# Patient Record
Sex: Male | Born: 1955 | Race: White | Hispanic: No | Marital: Married | Smoking: Never smoker
Health system: Southern US, Community
[De-identification: ages and names within clinical notes are randomized; demographics above are authoritative.]

## PROBLEM LIST (undated history)

## (undated) DIAGNOSIS — I10 Essential (primary) hypertension: Secondary | ICD-10-CM

## (undated) DIAGNOSIS — C439 Malignant melanoma of skin, unspecified: Secondary | ICD-10-CM

## (undated) DIAGNOSIS — R1011 Right upper quadrant pain: Secondary | ICD-10-CM

## (undated) HISTORY — PX: KNEE SURGERY: SHX244

## (undated) HISTORY — PX: TONSILLECTOMY: SUR1361

---
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
## 2015-07-21 DIAGNOSIS — C439 Malignant melanoma of skin, unspecified: Secondary | ICD-10-CM | POA: Insufficient documentation

## 2015-07-21 DIAGNOSIS — Z86010 Personal history of colonic polyps: Secondary | ICD-10-CM

## 2015-07-21 HISTORY — DX: Personal history of colonic polyps: Z86.010

## 2015-10-08 DIAGNOSIS — E782 Mixed hyperlipidemia: Secondary | ICD-10-CM

## 2015-10-08 DIAGNOSIS — M79602 Pain in left arm: Secondary | ICD-10-CM | POA: Insufficient documentation

## 2015-10-08 DIAGNOSIS — M79601 Pain in right arm: Secondary | ICD-10-CM

## 2015-10-08 DIAGNOSIS — I10 Essential (primary) hypertension: Secondary | ICD-10-CM

## 2015-10-08 HISTORY — DX: Essential (primary) hypertension: I10

## 2015-10-08 HISTORY — DX: Pain in right arm: M79.601

## 2015-10-08 HISTORY — DX: Pain in left arm: M79.602

## 2015-10-08 HISTORY — DX: Mixed hyperlipidemia: E78.2

## 2016-04-15 DIAGNOSIS — D696 Thrombocytopenia, unspecified: Secondary | ICD-10-CM

## 2016-04-15 DIAGNOSIS — M5030 Other cervical disc degeneration, unspecified cervical region: Secondary | ICD-10-CM

## 2016-04-15 HISTORY — DX: Thrombocytopenia, unspecified: D69.6

## 2016-04-15 HISTORY — DX: Other cervical disc degeneration, unspecified cervical region: M50.30

## 2017-11-04 DIAGNOSIS — F419 Anxiety disorder, unspecified: Secondary | ICD-10-CM

## 2017-11-04 DIAGNOSIS — R1011 Right upper quadrant pain: Secondary | ICD-10-CM

## 2017-11-04 HISTORY — DX: Anxiety disorder, unspecified: F41.9

## 2017-11-04 HISTORY — DX: Right upper quadrant pain: R10.11

## 2017-11-11 DIAGNOSIS — K760 Fatty (change of) liver, not elsewhere classified: Secondary | ICD-10-CM

## 2017-11-11 HISTORY — DX: Fatty (change of) liver, not elsewhere classified: K76.0

## 2018-09-01 ENCOUNTER — Emergency Department (HOSPITAL_BASED_OUTPATIENT_CLINIC_OR_DEPARTMENT_OTHER): Payer: 59

## 2018-09-01 ENCOUNTER — Encounter (HOSPITAL_BASED_OUTPATIENT_CLINIC_OR_DEPARTMENT_OTHER): Payer: Self-pay

## 2018-09-01 ENCOUNTER — Other Ambulatory Visit: Payer: Self-pay

## 2018-09-01 ENCOUNTER — Emergency Department (HOSPITAL_BASED_OUTPATIENT_CLINIC_OR_DEPARTMENT_OTHER)
Admission: EM | Admit: 2018-09-01 | Discharge: 2018-09-02 | Disposition: A | Discharge status: Home / Self Care | Payer: 59 | Attending: Emergency Medicine | Admitting: Emergency Medicine

## 2018-09-01 DIAGNOSIS — R0789 Other chest pain: Secondary | ICD-10-CM | POA: Diagnosis present

## 2018-09-01 DIAGNOSIS — I10 Essential (primary) hypertension: Secondary | ICD-10-CM | POA: Insufficient documentation

## 2018-09-01 DIAGNOSIS — Z79899 Other long term (current) drug therapy: Secondary | ICD-10-CM | POA: Diagnosis not present

## 2018-09-01 HISTORY — DX: Malignant melanoma of skin, unspecified: C43.9

## 2018-09-01 HISTORY — DX: Essential (primary) hypertension: I10

## 2018-09-01 LAB — CBC
HCT: 43 % (ref 39.0–52.0)
Hemoglobin: 15.3 g/dL (ref 13.0–17.0)
MCH: 31.8 pg (ref 26.0–34.0)
MCHC: 35.6 g/dL (ref 30.0–36.0)
MCV: 89.4 fL (ref 80.0–100.0)
Platelets: 142 10*3/uL — ABNORMAL LOW (ref 150–400)
RBC: 4.81 MIL/uL (ref 4.22–5.81)
RDW: 12.8 % (ref 11.5–15.5)
WBC: 8.3 10*3/uL (ref 4.0–10.5)
nRBC: 0 % (ref 0.0–0.2)

## 2018-09-01 LAB — TROPONIN I
Troponin I: <0.03 ng/mL (ref <0.03)
Troponin I: <0.03 ng/mL (ref <0.03)

## 2018-09-01 LAB — BASIC METABOLIC PANEL
Anion gap: 9 (ref 5–15)
BUN: 23 mg/dL (ref 8–23)
CO2: 23 mmol/L (ref 22–32)
Calcium: 9 mg/dL (ref 8.9–10.3)
Chloride: 105 mmol/L (ref 98–111)
Creatinine, Ser: 1.41 mg/dL — ABNORMAL HIGH (ref 0.61–1.24)
GFR calc Af Amer: >60 mL/min (ref >60)
GFR calc non Af Amer: 53 mL/min — ABNORMAL LOW (ref >60)
Glucose, Bld: 121 mg/dL — ABNORMAL HIGH (ref 70–99)
Potassium: 3.9 mmol/L (ref 3.5–5.1)
Sodium: 137 mmol/L (ref 135–145)

## 2018-09-01 MED ORDER — METHYLPREDNISOLONE SODIUM SUCC 125 MG IJ SOLR
125.0000 mg | Freq: Once | INTRAMUSCULAR | Status: DC
  Filled 2018-09-01: qty 2

## 2018-09-01 MED ORDER — SODIUM CHLORIDE 0.9% FLUSH
3.0000 mL | Freq: Once | INTRAVENOUS | Status: DC
  Filled 2018-09-01: qty 3

## 2018-09-01 MED ORDER — ALBUTEROL SULFATE HFA 108 (90 BASE) MCG/ACT IN AERS: 1.0000 | INHALATION_SPRAY | Freq: Four times a day (QID) | RESPIRATORY_TRACT | 0 refills | Status: DC | PRN

## 2018-09-01 MED ORDER — PREDNISONE 10 MG PO TABS: 40.0000 mg | ORAL_TABLET | Freq: Every day | ORAL | 0 refills | Status: AC

## 2018-09-01 NOTE — Discharge Instructions (Addendum)
Please follow-up with your primary care provider for your scheduled outpatient cardiac workup including stress test and echo. I have requested an appointment with our cardiologist here - if they can get you in sooner, then see them.  Until you see the cardiologist, take aspirin 81 mg once a day.  If you have more chest pain, take a nitroglycerin pill under your tongue. You can take as many as three pills, five minutes apart. If you still have pain after the third pill, come to the hospital immediately.  Thank you for allowing me to care for you today. Please return to the emergency department if you have new or worsening symptoms. Take your medications as instructed.

## 2018-09-01 NOTE — ED Triage Notes (Signed)
C/o CP x 3 week-seen by PCP with plans for additional testing-NAD-steady gait

## 2018-09-01 NOTE — ED Provider Notes (Signed)
[facility] EMERGENCY DEPARTMENT Provider Note   CSN: [id number] Arrival date & time: 09/01/18  1848    History   Chief Complaint Chief Complaint  Patient presents with  . Chest Pain    HPI Evan Beltran is a 63 y.o. male.     Patient is a 63 year old patient with PMH HTN and melanoma presenting to the ED for feeling unwell with chest pain. Patient reports the chest pain has been ongoing for several weeks and was actually improved overall today. He saw PMD for this and is scheduled for echo and stress test. He reports today he felt a pain that he never felt before in his upper gums and then in the R jaw and was concerned so he wanted to come and get it checked out. Currently denies chest pain. Reports when it comes on it is a dull ache in the L chest and is transient. It is not exertional. Denies SOB, n/v/d. Has chronic L arm pain due to musculoskeletal issues for which he gets periodic steroid injections over the years. No arm pain today. Jaw pain lasted a few minutes and resided. Reports this occasionally comes on when he chews     Past Medical History:  Diagnosis Date  . Hypertension   . Melanoma ([place])     There are no active problems to display for this patient.   Past Surgical History:  Procedure Laterality Date  . KNEE SURGERY    . TONSILLECTOMY          Home Medications    Prior to Admission medications   Medication Sig Start Date End Date Taking? Authorizing Provider  lisinopril (ZESTRIL) 10 MG tablet TAKE 1 TABLET BY MOUTH EVERY DAY 05/06/17  Yes [provider]  albuterol (VENTOLIN HFA) 108 (90 Base) MCG/ACT inhaler Inhale 1-2 puffs into the lungs every 6 (six) hours as needed for wheezing or shortness of breath. 09/01/18   Madilyn Hook A, PA-C  predniSONE (DELTASONE) 10 MG tablet Take 4 tablets (40 mg total) by mouth daily for 5 days. 09/01/18 09/06/18  Alveria Apley, PA-C    Family History No family history on file.  Social History  Social History   Tobacco Use  . Smoking status: Never Smoker  . Smokeless tobacco: Never Used  Substance Use Topics  . Alcohol use: Yes    Comment: occ  . Drug use: Never     Allergies   Patient has no known allergies.   Review of Systems Review of Systems  Constitutional: Negative for chills and fever.  HENT: Positive for dental problem. Negative for ear pain, postnasal drip, sore throat and trouble swallowing.   Eyes: Negative for pain and visual disturbance.  Respiratory: Negative for cough and shortness of breath.   Cardiovascular: Positive for chest pain. Negative for palpitations.  Gastrointestinal: Negative for abdominal pain, nausea and vomiting.  Genitourinary: Negative for dysuria and hematuria.  Musculoskeletal: Positive for arthralgias. Negative for back pain, neck pain and neck stiffness.  Skin: Negative for color change and rash.  Neurological: Negative for dizziness, syncope and light-headedness.  All other systems reviewed and are negative.    Physical Exam Updated Vital Signs BP 123/83   Pulse 75   Temp 98.5 F (36.9 C) (Oral)   Resp (!) 21   Ht 6' (1.829 m)   Wt 98.9 kg   SpO2 98%   BMI 29.57 kg/m   Physical Exam Vitals signs and nursing note reviewed.  Constitutional:  Appearance: He is well-developed.  HENT:     Head: Normocephalic and atraumatic.     Jaw: There is normal jaw occlusion. No tenderness.     Mouth/Throat:     Lips: Pink.     Mouth: Mucous membranes are moist.     Dentition: Normal dentition. No dental tenderness, dental abscesses or gum lesions.     Tongue: No lesions. Tongue does not deviate from midline.     Pharynx: Oropharynx is clear. Uvula midline. No pharyngeal swelling, oropharyngeal exudate, posterior oropharyngeal erythema or uvula swelling.  Eyes:     Conjunctiva/sclera: Conjunctivae normal.  Neck:     Musculoskeletal: Neck supple.  Cardiovascular:     Rate and Rhythm: Normal rate and regular rhythm.      Heart sounds: No murmur.  Pulmonary:     Effort: Pulmonary effort is normal. No respiratory distress.     Breath sounds: Normal breath sounds.  Abdominal:     Palpations: Abdomen is soft.     Tenderness: There is no abdominal tenderness.  Skin:    General: Skin is warm and dry.  Neurological:     Mental Status: He is alert.      ED Treatments / Results  Labs (all labs ordered are listed, but only abnormal results are displayed) Labs Reviewed  BASIC METABOLIC PANEL - Abnormal; Notable for the following components:      Result Value   Glucose, Bld 121 (*)    Creatinine, Ser 1.41 (*)    GFR calc non Af Amer 53 (*)    All other components within normal limits  CBC - Abnormal; Notable for the following components:   Platelets 142 (*)    All other components within normal limits  TROPONIN I  TROPONIN I  TROPONIN I    EKG EKG Interpretation  Date/Time:  Thursday Sep 01 2018 18:59:16 EDT Ventricular Rate:  71 PR Interval:  168 QRS Duration: 92 QT Interval:  386 QTC Calculation: 419 R Axis:   -38 Text Interpretation:  Normal sinus rhythm with sinus arrhythmia Left axis deviation Abnormal ECG no acute ischemic changes. no ld comparison Confirmed by Charlesetta Shanks [phone]) on 09/01/2018 9:47:19 PM   Radiology Dg Chest 2 View  Result Date: 09/01/2018 CLINICAL DATA:  63 y/o  M; 3 weeks of left-sided chest pain. EXAM: CHEST - 2 VIEW COMPARISON:  None. FINDINGS: The heart size and mediastinal contours are within normal limits. Both lungs are clear. Mild S-shaped thoracic spine scoliosis and straightening of kyphosis. No acute osseous abnormality is evident. IMPRESSION: No acute pulmonary process identified. Electronically Signed   By: Kristine Garbe M.D.   On: 09/01/2018 20:01    Procedures Procedures (including critical care time)  Medications Ordered in ED Medications  sodium chloride flush (NS) 0.9 % injection 3 mL (has no administration in time range)      Initial Impression / Assessment and Plan / ED Course  I have reviewed the triage vital signs and the nursing notes.  Pertinent labs & imaging results that were available during my care of the patient were reviewed by me and considered in my medical decision making (see chart for details).  Clinical Course as of May 22 0001  Thu Sep 01, 2018  2355 Patient remained chest pain free throughout our visit. Attempted to call [facility] but they stated patient was not a patient of theirs. It appears patients cardiac workup will be done by primary care provider initially. Patient stable and trop negative  x2. No ekg changes. Will obtain trop x3 and if normal, may discharge home to PMD follow up. Patient care passed to Dr. Roxanne Mins due to change of shift.     [KM]    Clinical Course User Index [KM] Alveria Apley, PA-C         Final Clinical Impressions(s) / ED Diagnoses   Final diagnoses:  Atypical chest pain    ED Discharge Orders         Ordered    predniSONE (DELTASONE) 10 MG tablet  Daily     09/01/18 2022    albuterol (VENTOLIN HFA) 108 (90 Base) MCG/ACT inhaler  Every 6 hours PRN     09/01/18 2022           Kristine Royal [birth year] [id number]    Delora Fuel, MD 70/78/67 [phone]

## 2018-09-01 NOTE — ED Notes (Signed)
[place] wife to be updated with bed placement

## 2018-09-02 LAB — TROPONIN I: Troponin I: <0.03 ng/mL (ref <0.03)

## 2018-09-02 MED ORDER — NITROGLYCERIN 0.4 MG SL SUBL: 0.4000 mg | SUBLINGUAL_TABLET | SUBLINGUAL | 0 refills | Status: AC | PRN

## 2018-09-02 NOTE — ED Notes (Signed)
ED Provider at bedside. 

## 2018-09-16 DIAGNOSIS — R0789 Other chest pain: Secondary | ICD-10-CM

## 2018-09-16 HISTORY — DX: Other chest pain: R07.89

## 2018-09-16 NOTE — Progress Notes (Signed)
Cardiology Office Note:    Date:  09/19/2018   ID:  Evan Beltran, DOB [birth year], MRN [id number]  PCP:  Houston Siren., MD  Cardiologist:  Shirlee More, MD   Referring MD: Delora Fuel, MD  ASSESSMENT:    1. Atypical chest pain   2. Abnormal electrocardiogram (ECG) (EKG)   3. Benign essential hypertension   4. Mixed hyperlipidemia    PLAN:    In order of problems listed above:  1. Chest pain is ongoing further evaluation cardiac CTA class I 2. EKG has nonspecific abnormalities axis deviation 3. Stable hyperlipidemia continue current treatment beta-blocker ACE inhibitor 4. Mild hyperlipidemia if he has CAD he would benefit from statin therapy and a goal LDL in the range of 50.  Next appointment in 6 weeks   Medication Adjustments/Labs and Tests Ordered: Current medicines are reviewed at length with the patient today.  Concerns regarding medicines are outlined above.  Orders Placed This Encounter  Procedures  . CT CORONARY MORPH W/CTA COR W/SCORE W/CA W/CM &/OR WO/CM  . CT CORONARY FRACTIONAL FLOW RESERVE FLUID ANALYSIS  . CT CORONARY FRACTIONAL FLOW RESERVE DATA PREP   Meds ordered this encounter  Medications  . metoprolol tartrate (LOPRESSOR) 50 MG tablet    Sig: Take 2 tabs (100 mg) 2 hour prior to CT    Dispense:  2 tablet    Refill:  0     Chief Complaint  Patient presents with  . Chest Pain    History of Present Illness:    Evan Beltran is a 63 y.o. male with hypertensionwho is being seen today for the evaluation of chest pain at the request of Delora Fuel, MD following ED visit 09/01/18.   Ref Range & Units 2wk ago (09/02/18) 2wk ago (09/01/18) 2wk ago (09/01/18)  Troponin I <0.03 ng/mL <0.03  <0.03 CM <0.03   EKG EKG Interpretation  Date/Time:                  Thursday Sep 01 2018 18:59:16 EDT Ventricular Rate:         71 PR Interval:                 168 QRS Duration: 92 QT Interval:                 386 QTC Calculation:        419 R Axis:                          -38 Text Interpretation:       Normal sinus rhythm with sinus arrhythmia Left axis deviation Abnormal ECG no acute ischemic changes. no ld comparison Confirmed by Charlesetta Shanks [phone]) on 09/01/2018 9:47:19 PM  CXR 09/01/18: FINDINGS: The heart size and mediastinal contours are within normal limits. Both lungs are clear. Mild S-shaped thoracic spine scoliosis and straightening of kyphosis. No acute osseous abnormality is evident.  IMPRESSION: No acute pulmonary process identified.  Stress echo 09/15/18: Summary Normal resting biventricular function (ejection fraction), with no resting segmental abnormality. No clinical or echocardiographic ischemia (induced wall motion abnormality): Negative stress echocardiogram with target HR achieved. Signature ------------------------------------------------------------------------------  Electronically signed by Clarene Critchley, MD, FACC(Interpreting physician)  on 09/15/2018 03:50 PM ------------------------------------------------------------------------------  Rest  ECG  Sinus rhythm.  Right bundle branch block.  Stress  Stress Type: Exercise  Peak HR: 144 bpm             HR Response: Normal  Peak BP: 170/80 mmHg           BP Response: Normal  Predicted HR: 157 bpm           HR BP Product: 24480  % of predicted HR: 92           Max Exercise: 8.5 METS  Test Duration: 5.02 min  Reason for Termination: Target heart rate Exercise Effort: Good  Stress Interpretation  Normal resting left ventricular function, with no resting segmental  abnormality.  Normal hypercontractile response throughout, with no induced wall motion  abnormality.  Appropriate hemodynamic response to exercise.  Results  Global LVEF (rest): Normal (LVEF >50%)  Global LVEF (stress): Hyperkinetic (LVEF >70%)  ECG  No ST-T wave changes.  Arrhythmias  No rhythm abnormality.  Symptoms  No  cardiovascular symptoms with maximal exercise.   He is here today for cardiology consultation.  First issue is that he continues to have left axillary and left chest pain nonexertional waxes and wanes through the day.  He had a stress test ordered when he was seen by his PCP in May and the result was normal stress echo.  He is very concerned about cardiac disease his brother last Friday had some atypical symptoms had a cardiac arrest and not having multivessel PCI performed.  His chest pain is nonexertional unrelieved with rest waxes and wanes through the day some soreness in the left shoulder but also has had aching in his jaw.  In fact he had an episode coming to the office today and attributes this to stress and seems to occur with stress.  He is on medical therapy for hypertension.  After discussion of the benefits and limitations of stress test modalities undergo a cardiac CTA.  Allow [place] to look at the noncardiac chest structures do a calcium score and define whether or not he has obstructive CAD to a higher degree of accuracy the noninvasive stress testing.  He has no dye allergy and will be scheduled as class I.  If his symptoms worsen become more typical he will present for repeat evaluation of troponin would be positive at that time treat as acute coronary syndrome.  He has no known history of congenital or rheumatic heart disease he has no exercise intolerance exertional shortness of breath palpitations syncope or TIA.  He said no trauma to his chest his chest pain is mild to moderate intensity and is not pleuritic no associated shortness of breath no associated GI symptoms.  Past Medical History:  Diagnosis Date  . Anxiety 11/04/2017  . Atypical chest pain 09/16/2018  . Benign essential hypertension 10/08/2015  . DDD (degenerative disc disease), cervical 04/15/2016  . Fatty liver 11/11/2017  . History of adenomatous polyp of colon 07/21/2015  . Hypertension   . Melanoma ([place])   . Mixed hyperlipidemia  10/08/2015  . Paresthesia and pain of both upper extremities 10/08/2015  . RUQ abdominal pain 11/04/2017  . Thrombocytopenia ([place]) 04/15/2016    Past Surgical History:  Procedure Laterality Date  . KNEE SURGERY    . TONSILLECTOMY      Current Medications: Current Meds  Medication Sig  . aspirin EC 81 MG tablet Take 81 mg by mouth daily.  Marland Kitchen lisinopril (ZESTRIL) 10 MG tablet TAKE 1 TABLET BY MOUTH EVERY DAY  . nitroGLYCERIN (NITROSTAT) 0.4 MG SL tablet Place 1 tablet (0.4 mg total) under the tongue every 5 (five) minutes as needed for chest pain.     Allergies:   Patient has  no known allergies.   Social History   Socioeconomic History  . Marital status: Married    Spouse name: Not on file  . Number of children: Not on file  . Years of education: Not on file  . Highest education level: Not on file  Occupational History  . Not on file  Social Needs  . Financial resource strain: Not on file  . Food insecurity:    Worry: Not on file    Inability: Not on file  . Transportation needs:    Medical: Not on file    Non-medical: Not on file  Tobacco Use  . Smoking status: Never Smoker  . Smokeless tobacco: Never Used  Substance and Sexual Activity  . Alcohol use: Yes    Comment: occ  . Drug use: Never  . Sexual activity: Not on file  Lifestyle  . Physical activity:    Days per week: Not on file    Minutes per session: Not on file  . Stress: Not on file  Relationships  . Social connections:    Talks on phone: Not on file    Gets together: Not on file    Attends religious service: Not on file    Active member of club or organization: Not on file    Attends meetings of clubs or organizations: Not on file    Relationship status: Not on file  Other Topics Concern  . Not on file  Social History Narrative  . Not on file     Family History: The patient's family history includes Alzheimer's disease in his father; Angina in his father; CAD in his brother and maternal aunt;  Cancer in his brother; Dementia in his father; Lung cancer in his mother.  ROS:   Review of Systems  Constitution: Negative.  HENT: Negative.   Eyes: Negative.   Cardiovascular: Positive for chest pain.  Respiratory: Negative.   Endocrine: Negative.   Hematologic/Lymphatic: Negative.   Skin: Negative.   Musculoskeletal: Negative.   Gastrointestinal: Negative.   Genitourinary: Negative.   Neurological: Negative.   Psychiatric/Behavioral: Negative.   Allergic/Immunologic: Negative.    Please see the history of present illness.     All other systems reviewed and are negative.  EKGs/Labs/Other Studies Reviewed:     Recent Labs: 09/01/2018: BUN 23; Creatinine, Ser 1.41; Hemoglobin 15.3; Platelets 142; Potassium 3.9; Sodium 137  Recent Lipid Panel 10/30/18: Chol 137 LDL 103 HDL 40  Physical Exam:    VS:  BP 116/82 (BP Location: Left Arm, Patient Position: Sitting, Cuff Size: Normal)   Pulse 66   Temp 97.9 F (36.6 C)   Ht 5\' 11"  (1.803 m)   Wt 214 lb 12.8 oz (97.4 kg)   SpO2 96%   BMI 29.96 kg/m     Wt Readings from Last 3 Encounters:  09/19/18 214 lb 12.8 oz (97.4 kg)  09/01/18 218 lb (98.9 kg)     GEN:  Well nourished, well developed in no acute distress HEENT: Normal NECK: No JVD; No carotid bruits LYMPHATICS: No lymphadenopathy CARDIAC: RRR, no murmurs, rubs, gallops RESPIRATORY:  Clear to auscultation without rales, wheezing or rhonchi  ABDOMEN: Soft, non-tender, non-distended MUSCULOSKELETAL:  No edema; No deformity  SKIN: Warm and dry NEUROLOGIC:  Alert and oriented x 3 PSYCHIATRIC:  Normal affect     Signed, Shirlee More, MD  09/19/2018 8:51 AM    [place]

## 2018-09-19 ENCOUNTER — Encounter: Payer: Self-pay | Admitting: Cardiology

## 2018-09-19 ENCOUNTER — Ambulatory Visit (INDEPENDENT_AMBULATORY_CARE_PROVIDER_SITE_OTHER): Payer: 59 | Admitting: Cardiology

## 2018-09-19 ENCOUNTER — Other Ambulatory Visit: Payer: Self-pay

## 2018-09-19 VITALS — BP 116/82 | HR 66 | Temp 97.9°F | Ht 71.0 in | Wt 214.8 lb

## 2018-09-19 DIAGNOSIS — E782 Mixed hyperlipidemia: Secondary | ICD-10-CM

## 2018-09-19 DIAGNOSIS — R0789 Other chest pain: Secondary | ICD-10-CM | POA: Diagnosis not present

## 2018-09-19 DIAGNOSIS — Z01812 Encounter for preprocedural laboratory examination: Secondary | ICD-10-CM

## 2018-09-19 DIAGNOSIS — I10 Essential (primary) hypertension: Secondary | ICD-10-CM

## 2018-09-19 DIAGNOSIS — R9431 Abnormal electrocardiogram [ECG] [EKG]: Secondary | ICD-10-CM

## 2018-09-19 MED ORDER — METOPROLOL TARTRATE 50 MG PO TABS: ORAL_TABLET | ORAL | 0 refills | Status: DC

## 2018-09-19 NOTE — Patient Instructions (Signed)
Medication Instructions:  Your physician recommends that you continue on your current medications as directed. Please refer to the Current Medication list given to you today.  If you need a refill on your cardiac medications before your next appointment, please call your pharmacy.   Lab work: Your physician recommends that you return for lab work in:  BMP 3-7 days prior to CT  If you have labs (blood work) drawn today and your tests are completely normal, you will receive your results only by: Marland Kitchen MyChart Message (if you have MyChart) OR . A paper copy in the mail If you have any lab test that is abnormal or we need to change your treatment, we will call you to review the results.  Testing/Procedures: Your physician has requested that you have cardiac CT. Cardiac computed tomography (CT) is a painless test that uses an x-ray machine to take clear, detailed pictures of your heart. For further information please visit [employer]uge[employer]tn. Please follow instruction sheet as given.  Please arrive at the [facility] main entrance of [facility] at xx:xx AM (30-45 minutes prior to test start time)  [facility] [place], [place] [address] [phone]  Proceed to the [facility] Radiology Department (First Floor).  Please follow these instructions carefully (unless otherwise directed):  Hold all erectile dysfunction medications at least 48 hours prior to test.  On the Night Before the Test: . Be sure to Drink plenty of water. . Do not consume any caffeinated/decaffeinated beverages or chocolate 12 hours prior to your test. . Do not take any antihistamines 12 hours prior to your test.  On the Day of the Test: . Drink plenty of water. Do not drink any water within one hour of the test. . Do not eat any food 4 hours prior to the test. . You may take your regular medications prior to the test.  . Take metoprolol (Lopressor) two hours prior to test.    *For Clinical Staff only. Please instruct patient the following:*        -Drink plenty of water       -Hold Furosemide/hydrochlorothiazide morning of the test       -Take metoprolol (Lopressor) 2 hours prior to test (if applicable).                  -If HR is less than 55 BPM- No Beta Blocker                -IF HR is greater than 55 BPM and patient is less than or equal to 74 yrs old Lopressor 100mg  x1.       After the Test: . Drink plenty of water. . After receiving IV contrast, you may experience a mild flushed feeling. This is normal. . On occasion, you may experience a mild rash up to 24 hours after the test. This is not dangerous. If this occurs, you can take Benadryl 25 mg and increase your fluid intake. . If you experience trouble breathing, this can be serious. If it is severe call 911 IMMEDIATELY. If it is mild, please call our office. . If you take any of these medications: Glipizide/Metformin, Avandament, Glucavance, please do not take 48 hours after completing test.   Follow-Up: At [facility], you and your health needs are our priority.  As part of our continuing mission to provide you with exceptional heart care, we have created designated Provider Care Teams.  These Care Teams include your primary  Cardiologist (physician) and Advanced Practice Providers (APPs -  Physician Assistants and Nurse Practitioners) who all work together to provide you with the care you need, when you need it. You will need a follow up appointment in 6 weeks.   Any Other Special Instructions Will Be Listed Below (If Applicable).    Cardiac CT Angiogram  A cardiac CT angiogram is a procedure to look at the heart and the area around the heart. It may be done to help find the cause of chest pains or other symptoms of heart disease. During this procedure, a large X-ray machine, called a CT scanner, takes detailed pictures of the heart and the surrounding area after a dye (contrast material) has been  injected into blood vessels in the area. The procedure is also sometimes called a coronary CT angiogram, coronary artery scanning, or CTA. A cardiac CT angiogram allows the health care provider to see how well blood is flowing to and from the heart. The health care provider will be able to see if there are any problems, such as:  Blockage or narrowing of the coronary arteries in the heart.  Fluid around the heart.  Signs of weakness or disease in the muscles, valves, and tissues of the heart. Tell a health care provider about:  Any allergies you have. This is especially important if you have had a previous allergic reaction to contrast dye.  All medicines you are taking, including vitamins, herbs, eye drops, creams, and over-the-counter medicines.  Any blood disorders you have.  Any surgeries you have had.  Any medical conditions you have.  Whether you are pregnant or may be pregnant.  Any anxiety disorders, chronic pain, or other conditions you have that may increase your stress or prevent you from lying still. What are the risks? Generally, this is a safe procedure. However, problems may occur, including:  Bleeding.  Infection.  Allergic reactions to medicines or dyes.  Damage to other structures or organs.  Kidney damage from the dye or contrast that is used.  Increased risk of cancer from radiation exposure. This risk is low. Talk with your health care provider about: ? The risks and benefits of testing. ? How you can receive the lowest dose of radiation. What happens before the procedure?  Wear comfortable clothing and remove any jewelry, glasses, dentures, and hearing aids.  Follow instructions from your health care provider about eating and drinking. This may include: ? For 12 hours before the test - avoid caffeine. This includes tea, coffee, soda, energy drinks, and diet pills. Drink plenty of water or other fluids that do not have caffeine in them. Being  well-hydrated can prevent complications. ? For 4-6 hours before the test - stop eating and drinking. The contrast dye can cause nausea, but this is less likely if your stomach is empty.  Ask your health care provider about changing or stopping your regular medicines. This is especially important if you are taking diabetes medicines, blood thinners, or medicines to treat erectile dysfunction. What happens during the procedure?  Hair on your chest may need to be removed so that small sticky patches called electrodes can be placed on your chest. These will transmit information that helps to monitor your heart during the test.  An IV tube will be inserted into one of your veins.  You might be given a medicine to control your heart rate during the test. This will help to ensure that good images are obtained.  You will be asked to  lie on an exam table. This table will slide in and out of the CT machine during the procedure.  Contrast dye will be injected into the IV tube. You might feel warm, or you may get a metallic taste in your mouth.  You will be given a medicine (nitroglycerin) to relax (dilate) the arteries in your heart.  The table that you are lying on will move into the CT machine tunnel for the scan.  The person running the machine will give you instructions while the scans are being done. You may be asked to: ? Keep your arms above your head. ? Hold your breath. ? Stay very still, even if the table is moving.  When the scanning is complete, you will be moved out of the machine.  The IV tube will be removed. The procedure may vary among health care providers and hospitals. What happens after the procedure?  You might feel warm, or you may get a metallic taste in your mouth from the contrast dye.  You may have a headache from the nitroglycerin.  After the procedure, drink water or other fluids to wash (flush) the contrast material out of your body.  Contact a health care  provider if you have any symptoms of allergy to the contrast. These symptoms include: ? Shortness of breath. ? Rash or hives. ? A racing heartbeat.  Most people can return to their normal activities right after the procedure. Ask your health care provider what activities are safe for you.  It is up to you to get the results of your procedure. Ask your health care provider, or the department that is doing the procedure, when your results will be ready. Summary  A cardiac CT angiogram is a procedure to look at the heart and the area around the heart. It may be done to help find the cause of chest pains or other symptoms of heart disease.  During this procedure, a large X-ray machine, called a CT scanner, takes detailed pictures of the heart and the surrounding area after a dye (contrast material) has been injected into blood vessels in the area.  Ask your health care provider about changing or stopping your regular medicines before the procedure. This is especially important if you are taking diabetes medicines, blood thinners, or medicines to treat erectile dysfunction.  After the procedure, drink water or other fluids to wash (flush) the contrast material out of your body. This information is not intended to replace advice given to you by your health care provider. Make sure you discuss any questions you have with your health care provider. Document Released: 03/12/2008 Document Revised: 02/17/2016 Document Reviewed: 02/17/2016 Elsevier Interactive Patient Education  2019 [employer].

## 2018-10-06 ENCOUNTER — Other Ambulatory Visit: Payer: Self-pay | Admitting: *Deleted

## 2018-10-06 ENCOUNTER — Telehealth (HOSPITAL_COMMUNITY): Payer: Self-pay | Admitting: Emergency Medicine

## 2018-10-06 DIAGNOSIS — R0789 Other chest pain: Secondary | ICD-10-CM

## 2018-10-06 DIAGNOSIS — Z01812 Encounter for preprocedural laboratory examination: Secondary | ICD-10-CM

## 2018-10-06 LAB — BASIC METABOLIC PANEL
BUN/Creatinine Ratio: 20 (ref 10–24)
BUN: 25 mg/dL (ref 8–27)
CO2: 26 mmol/L (ref 20–29)
Calcium: 10.2 mg/dL (ref 8.6–10.2)
Chloride: 103 mmol/L (ref 96–106)
Creatinine, Ser: 1.24 mg/dL (ref 0.76–1.27)
GFR calc Af Amer: 71 mL/min/{1.73_m2} (ref >59)
GFR calc non Af Amer: 61 mL/min/{1.73_m2} (ref >59)
Glucose: 111 mg/dL — ABNORMAL HIGH (ref 65–99)
Potassium: 4.4 mmol/L (ref 3.5–5.2)
Sodium: 136 mmol/L (ref 134–144)

## 2018-10-06 NOTE — Telephone Encounter (Signed)
Left message on voicemail with name and callback number Amaiah Cristiano RN Navigator Cardiac Imaging [facility] [phone] Office [phone] Cell  

## 2018-10-07 ENCOUNTER — Ambulatory Visit (HOSPITAL_COMMUNITY)
Admission: RE | Admit: 2018-10-07 | Discharge: 2018-10-07 | Disposition: A | Discharge status: Home / Self Care | Payer: 59 | Source: Ambulatory Visit | Attending: Cardiology | Admitting: Cardiology

## 2018-10-07 ENCOUNTER — Ambulatory Visit (HOSPITAL_COMMUNITY): Admission: RE | Admit: 2018-10-07 | Payer: 59 | Source: Ambulatory Visit

## 2018-10-07 ENCOUNTER — Other Ambulatory Visit: Payer: Self-pay

## 2018-10-07 DIAGNOSIS — R9431 Abnormal electrocardiogram [ECG] [EKG]: Secondary | ICD-10-CM

## 2018-10-07 DIAGNOSIS — Z006 Encounter for examination for normal comparison and control in clinical research program: Secondary | ICD-10-CM

## 2018-10-07 DIAGNOSIS — R0789 Other chest pain: Secondary | ICD-10-CM

## 2018-10-07 MED ORDER — NITROGLYCERIN 0.4 MG SL SUBL
SUBLINGUAL_TABLET | SUBLINGUAL | Status: AC
  Filled 2018-10-07: qty 2

## 2018-10-07 MED ORDER — IOHEXOL 350 MG/ML SOLN
80.0000 mL | Freq: Once | INTRAVENOUS | Status: AC | PRN
  Administered 2018-10-07: 80 mL via INTRAVENOUS

## 2018-10-07 MED ORDER — NITROGLYCERIN 0.4 MG SL SUBL
0.8000 mg | SUBLINGUAL_TABLET | Freq: Once | SUBLINGUAL | Status: AC
  Administered 2018-10-07: 0.8 mg via SUBLINGUAL
  Filled 2018-10-07: qty 25

## 2018-10-07 NOTE — Research (Signed)
  CADFEM Informed Consent   Subject Name: Evan Beltran  Subject met inclusion and exclusion criteria.  The informed consent form, study requirements and expectations were reviewed with the subject and questions and concerns were addressed prior to the signing of the consent form.  The subject verbalized understanding of the trial requirements.  The subject agreed to participate in the CADFEM trial and signed the informed consent at 1135 on 10-07-2018.  The informed consent was obtained prior to performance of any protocol-specific procedures for the subject.  A copy of the signed informed consent was given to the subject and a copy was placed in the subject's medical record.   Clement Deneault

## 2018-10-07 NOTE — Progress Notes (Signed)
CT scan completed. Tolerated well. D/C home walking. Awake and alert. In no distress. 

## 2018-10-11 ENCOUNTER — Other Ambulatory Visit: Payer: Self-pay | Admitting: *Deleted

## 2018-10-11 MED ORDER — ROSUVASTATIN CALCIUM 5 MG PO TABS: 5.0000 mg | ORAL_TABLET | Freq: Every day | ORAL | 3 refills | Status: AC

## 2018-10-30 NOTE — Progress Notes (Signed)
Cardiology Office Note:    Date:  10/31/2018   ID:  Rielly Corlett, DOB 16-Jul-1955, MRN [id number]  PCP:  Houston Siren., MD  Cardiologist:  Shirlee More, MD    Referring MD: Houston Siren., MD    ASSESSMENT:    1. Atypical chest pain   2. Mixed hyperlipidemia   3. Benign essential hypertension    PLAN:    In order of problems listed above:  1. Stable cardiac CTA is best described as near normal has no obstructive CAD and in view of his personal choices and increased risk of CAD over time we will continue low-dose aspirin and statin.  I will plan to see him back in my office as needed. 2. Continue a statin he will need a follow-up lipid profile plans to do a yearly exam PCP office 3. Stable hypertension BP at target continue ACE inhibitor   Next appointment: As needed   Medication Adjustments/Labs and Tests Ordered: Current medicines are reviewed at length with the patient today.  Concerns regarding medicines are outlined above.  No orders of the defined types were placed in this encounter.  No orders of the defined types were placed in this encounter.   Chief Complaint  Patient presents with   Follow-up    after CCTA    History of Present Illness:    Cliff Damiani is a 63 y.o. male with a hx of chest pain last seen 09/19/2018 and referred for cardiac CTA.Marland Kitchen  10/08/2018:   IMPRESSION: 1. Coronary calcium score of 0.5. This was 25 percentile for age and sex matched control.  2. Normal coronary origin with right dominance.  3. Minimal CAD, CAD RADS 1, risk factor modification is recommended. Consider non-cardiac causes of chest pain.   Compliance with diet, lifestyle and medications: Yes  He is quite reassured by his cardiac CTA.  We discussed options benefits he wants to stay on low-dose aspirin and a statin with his family history of his brother's recent myocardial infarction.  Offered to check a lipid profile but he said he is due for wellness yearly  exam and have them done at his PCPs office he has had no chest pain shortness of breath palpitation or syncope Past Medical History:  Diagnosis Date   Anxiety 11/04/2017   Atypical chest pain 09/16/2018   Benign essential hypertension 10/08/2015   DDD (degenerative disc disease), cervical 04/15/2016   Fatty liver 11/11/2017   History of adenomatous polyp of colon 07/21/2015   Hypertension    Melanoma ([place])    Mixed hyperlipidemia 10/08/2015   Paresthesia and pain of both upper extremities 10/08/2015   RUQ abdominal pain 11/04/2017   Thrombocytopenia ([place]) 04/15/2016    Past Surgical History:  Procedure Laterality Date   KNEE SURGERY     TONSILLECTOMY      Current Medications: Current Meds  Medication Sig   aspirin EC 81 MG tablet Take 81 mg by mouth daily.   lisinopril (ZESTRIL) 10 MG tablet TAKE 1 TABLET BY MOUTH EVERY DAY   nitroGLYCERIN (NITROSTAT) 0.4 MG SL tablet Place 1 tablet (0.4 mg total) under the tongue every 5 (five) minutes as needed for chest pain.   rosuvastatin (CRESTOR) 5 MG tablet Take 1 tablet (5 mg total) by mouth daily.     Allergies:   Patient has no known allergies.   Social History   Socioeconomic History   Marital status: Married    Spouse name: Not on file   Number of children: Not  on file   Years of education: Not on file   Highest education level: Not on file  Occupational History   Not on file  Social Needs   Financial resource strain: Not on file   Food insecurity    Worry: Not on file    Inability: Not on file   Transportation needs    Medical: Not on file    Non-medical: Not on file  Tobacco Use   Smoking status: Never Smoker   Smokeless tobacco: Never Used  Substance and Sexual Activity   Alcohol use: Yes    Comment: occ   Drug use: Never   Sexual activity: Not on file  Lifestyle   Physical activity    Days per week: Not on file    Minutes per session: Not on file   Stress: Not on file  Relationships     Social connections    Talks on phone: Not on file    Gets together: Not on file    Attends religious service: Not on file    Active member of club or organization: Not on file    Attends meetings of clubs or organizations: Not on file    Relationship status: Not on file  Other Topics Concern   Not on file  Social History Narrative   Not on file     Family History: The patient's family history includes Alzheimer's disease in his father; Angina in his father; CAD in his brother and maternal aunt; Cancer in his brother; Dementia in his father; Lung cancer in his mother. ROS:   Please see the history of present illness.    All other systems reviewed and are negative.  EKGs/Labs/Other Studies Reviewed:    The following studies were reviewed today:    Recent Labs: 09/01/2018: Hemoglobin 15.3; Platelets 142 10/06/2018: BUN 25; Creatinine, Ser 1.24; Potassium 4.4; Sodium 136  Recent Lipid Panel No results found for: CHOL, TRIG, HDL, CHOLHDL, VLDL, LDLCALC, LDLDIRECT  Physical Exam:    VS:  BP 112/82 (BP Location: Right Arm, Patient Position: Sitting, Cuff Size: Large)    Pulse 71    Temp 98.2 F (36.8 C)    Ht 5\' 11"  (1.803 m)    Wt 215 lb (97.5 kg)    SpO2 98%    BMI 29.99 kg/m     Wt Readings from Last 3 Encounters:  10/31/18 215 lb (97.5 kg)  09/19/18 214 lb 12.8 oz (97.4 kg)  09/01/18 218 lb (98.9 kg)     GEN:  Well nourished, well developed in no acute distress HEENT: Normal NECK: No JVD; No carotid bruits LYMPHATICS: No lymphadenopathy CARDIAC: RRR, no murmurs, rubs, gallops RESPIRATORY:  Clear to auscultation without rales, wheezing or rhonchi  ABDOMEN: Soft, non-tender, non-distended MUSCULOSKELETAL:  No edema; No deformity  SKIN: Warm and dry NEUROLOGIC:  Alert and oriented x 3 PSYCHIATRIC:  Normal affect    Signed, Shirlee More, MD  10/31/2018 8:33 AM    [place]

## 2018-10-31 ENCOUNTER — Other Ambulatory Visit: Payer: Self-pay

## 2018-10-31 ENCOUNTER — Ambulatory Visit (INDEPENDENT_AMBULATORY_CARE_PROVIDER_SITE_OTHER): Payer: 59 | Admitting: Cardiology

## 2018-10-31 ENCOUNTER — Encounter: Payer: Self-pay | Admitting: Cardiology

## 2018-10-31 VITALS — BP 112/82 | HR 71 | Temp 98.2°F | Ht 71.0 in | Wt 215.0 lb

## 2018-10-31 DIAGNOSIS — E782 Mixed hyperlipidemia: Secondary | ICD-10-CM | POA: Diagnosis not present

## 2018-10-31 DIAGNOSIS — R0789 Other chest pain: Secondary | ICD-10-CM

## 2018-10-31 DIAGNOSIS — I10 Essential (primary) hypertension: Secondary | ICD-10-CM | POA: Diagnosis not present

## 2018-10-31 NOTE — Patient Instructions (Signed)
Medication Instructions:  Your physician recommends that you continue on your current medications as directed. Please refer to the Current Medication list given to you today.  If you need a refill on your cardiac medications before your next appointment, please call your pharmacy.   Lab work: NOne If you have labs (blood work) drawn today and your tests are completely normal, you will receive your results only by: Marland Kitchen MyChart Message (if you have MyChart) OR . A paper copy in the mail If you have any lab test that is abnormal or we need to change your treatment, we will call you to review the results.  Testing/Procedures: None  Follow-Up: At [facility], you and your health needs are our priority.  As part of our continuing mission to provide you with exceptional heart care, we have created designated Provider Care Teams.  These Care Teams include your primary Cardiologist (physician) and Advanced Practice Providers (APPs -  Physician Assistants and Nurse Practitioners) who all work together to provide you with the care you need, when you need it. You will need a follow up appointment as neede Any Other Special Instructions Will Be Listed Below (If Applicable).

## 2019-12-24 IMAGING — CT CT HEAR MORPH WITH CTA COR WITH SCORE WITH CA WITH CONTRAST AND
4 of 7 series · 8 of 20 positions shown, 9 images · IV contrast (APPLIED)
Comparison: Chest radiographs dated 09/01/2018
COMPARISON: Chest radiographs dated 09/01/2018

Addendum:
EXAM:
OVER-READ INTERPRETATION  CT CHEST

The following report is an over-read performed by radiologist Dr.
Chouar Tout [REDACTED] on 10/07/2018. This
over-read does not include interpretation of cardiac or coronary
anatomy or pathology. The coronary CTA interpretation by the
cardiologist is attached.
CLINICAL DATA: 62-year-old male with h/o HTN, HLP, abnormal ECG and
atypical chest pain.
Cardiac/Coronary  CT
TECHNIQUE: The patient was scanned on a Phillips Force scanner.

[Series 7: best diast 75 % · axial · 0.40mm/px · z∈[-180,-123]mm · 2 of 432 slices shown, 3 images]
[im 144/432  vessel]
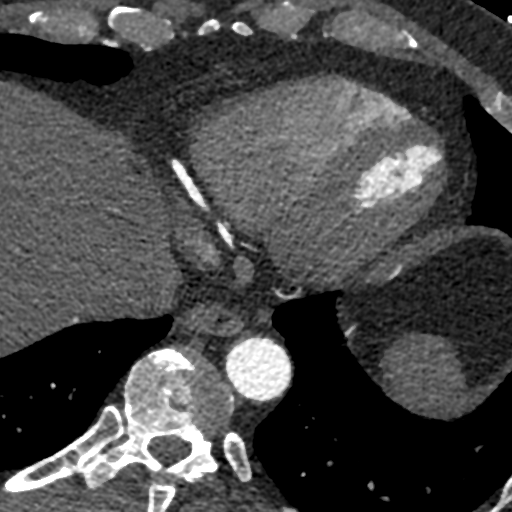
[im 144/432  lung]
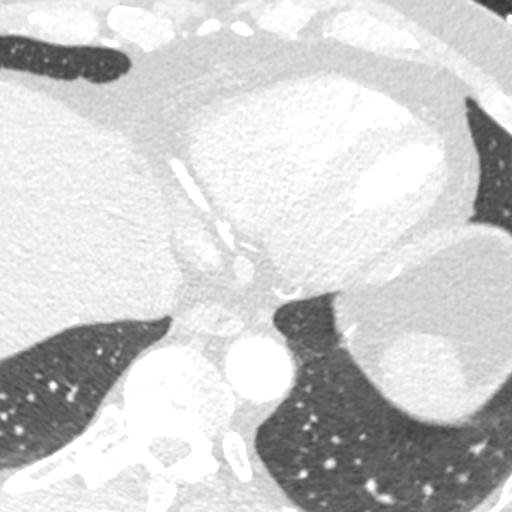
[im 288/432  vessel]
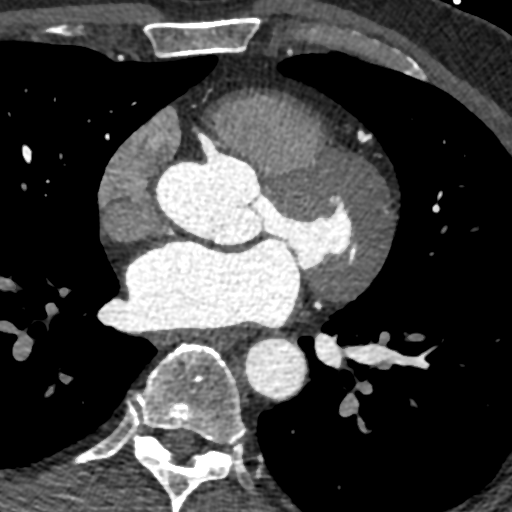

[Series 8: best syst 36 % · axial · 0.40mm/px · z∈[-180,-123]mm · 2 of 432 slices shown]
[im 144/432  vessel]
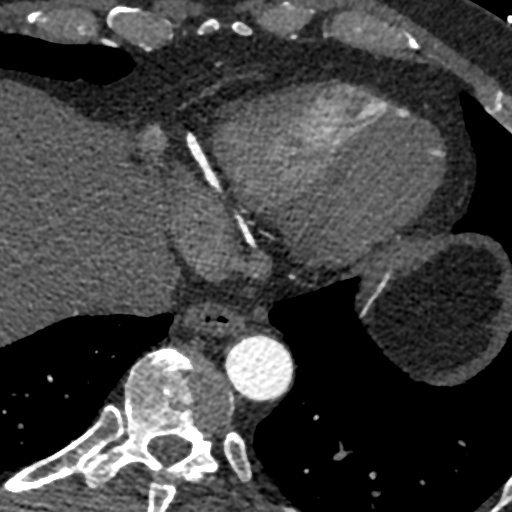
[im 288/432  vessel]
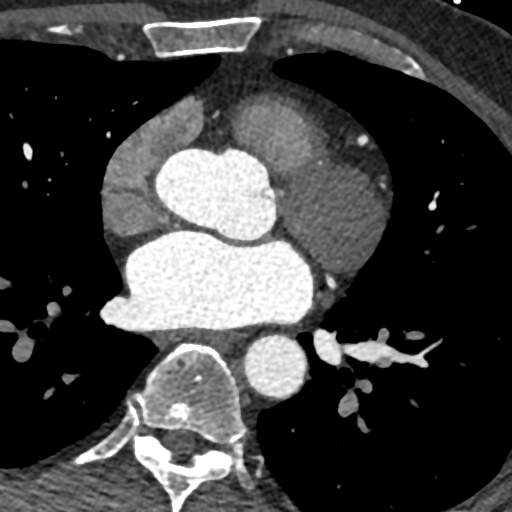

[Series 9: ts diast sharp 75 % · axial · 0.40mm/px · z∈[-180,-123]mm · 2 of 432 slices shown]
[im 144/432  lung]
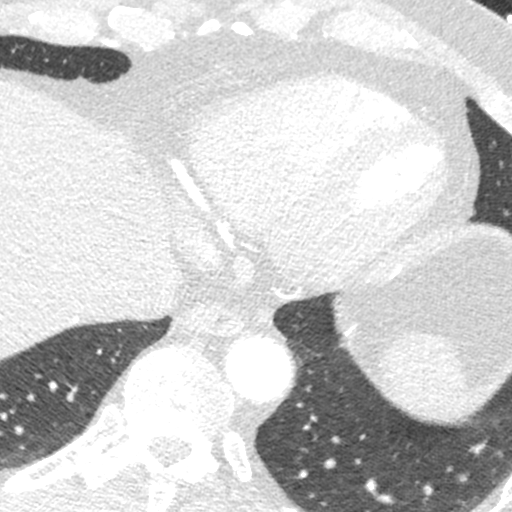
[im 288/432  lung]
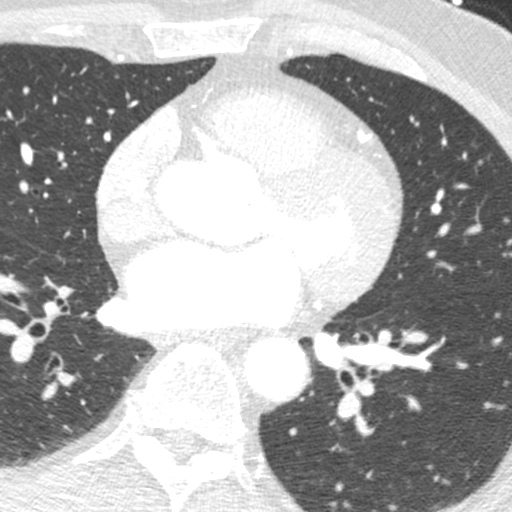

[Series 10: ts syst sharp 36 % · axial · 0.40mm/px · z∈[-180,-123]mm · 2 of 432 slices shown]
[im 144/432  lung]
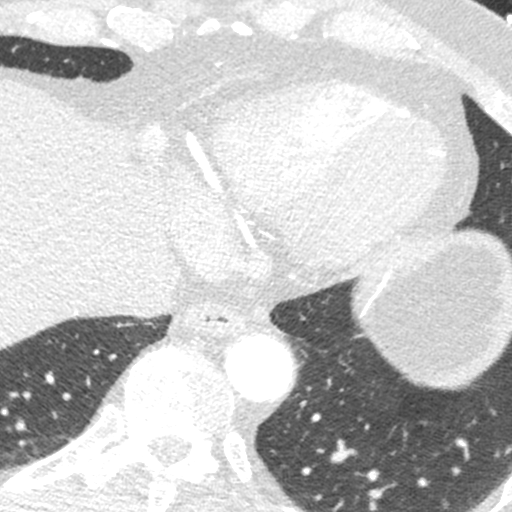
[im 288/432  lung]
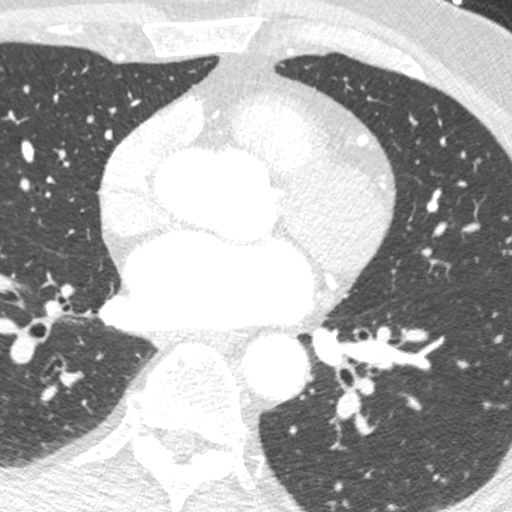

[8 of 20 positions shown; findings below may reference images not displayed]

FINDINGS: Vascular: Dedicated vascular evaluation reported separately. No
acute findings.

Mediastinum/Nodes: No suspicious mediastinal lymphadenopathy.

Lungs/Pleura: No suspicious pulmonary nodules. No focal
consolidation. Minimal dependent atelectasis in the bilateral lower
lobes. No pleural effusion or visualized pneumothorax.

Upper Abdomen: Visualized upper abdomen is unremarkable.

Musculoskeletal: Mild degenerative changes of the visualized
thoracolumbar spine.
IMPRESSION: No significant extracardiac findings.
FINDINGS: A 120 kV prospective scan was triggered in the descending thoracic
aorta at 111 HU's. Axial non-contrast 3 mm slices were carried out
through the heart. The data set was analyzed on a dedicated work
station and scored using the Agatson method. Gantry rotation speed
was 250 msecs and collimation was .6 mm. No beta blockade and 0.8 mg
of sl NTG was given. The 3D data set was reconstructed in 5%
intervals of the 67-82 % of the R-R cycle. Diastolic phases were
analyzed on a dedicated work station using MPR, MIP and VRT modes.
The patient received 80 cc of contrast.

Aorta: Normal size. Minimal diffuse atherosclerotic plaque. No
dissection.

Aortic Valve:  Trileaflet.  No calcifications.

Coronary Arteries:  Normal coronary origin.  Right dominance.

RCA is a large dominant artery that gives rise to PDA and PLVB.
There is no plaque.

Left main is a large artery that gives rise to LAD and LCX arteries.
Left main has no plaque.

LAD is a large vessel that gives rise to two diagonal arteries,
there is minimal non-calcified plaque in the proximal LAD with
stenosis 0-25%. Mid and distal LAD have no plaque.

D1,2 have no plaque.

LCX is a non-dominant artery that gives rise to one OM1 branch.
Proximal LCX artery has minimal non-calcified plaque with stenosis
0-25%.

Other findings:

Normal pulmonary vein drainage into the left atrium.

Normal let atrial appendage without a thrombus.

Normal size of the pulmonary artery.
IMPRESSION: 1. Coronary calcium score of 0.5. This was 25 percentile for age and
sex matched control.

2. Normal coronary origin with right dominance.

3. Minimal CAD, CAD RADS 1, risk factor modification is recommended.
Consider non-cardiac causes of chest pain.

*** End of Addendum ***
EXAM:
OVER-READ INTERPRETATION  CT CHEST

The following report is an over-read performed by radiologist Dr.
Chouar Tout [REDACTED] on 10/07/2018. This
over-read does not include interpretation of cardiac or coronary
anatomy or pathology. The coronary CTA interpretation by the
cardiologist is attached.
FINDINGS: Vascular: Dedicated vascular evaluation reported separately. No
acute findings.

Mediastinum/Nodes: No suspicious mediastinal lymphadenopathy.

Lungs/Pleura: No suspicious pulmonary nodules. No focal
consolidation. Minimal dependent atelectasis in the bilateral lower
lobes. No pleural effusion or visualized pneumothorax.

Upper Abdomen: Visualized upper abdomen is unremarkable.

Musculoskeletal: Mild degenerative changes of the visualized
thoracolumbar spine.
IMPRESSION: No significant extracardiac findings.
